# Patient Record
Sex: Male | Born: 2005 | Race: White | Hispanic: No | Marital: Single | Smoking: Current every day smoker
Health system: Southern US, Community
[De-identification: ages and names within clinical notes are randomized; demographics above are authoritative.]

## PROBLEM LIST (undated history)

## (undated) DIAGNOSIS — E0500 Thyrotoxicosis with diffuse goiter without thyrotoxic crisis or storm: Secondary | ICD-10-CM

## (undated) DIAGNOSIS — E119 Type 2 diabetes mellitus without complications: Secondary | ICD-10-CM

## (undated) DIAGNOSIS — F909 Attention-deficit hyperactivity disorder, unspecified type: Secondary | ICD-10-CM

## (undated) DIAGNOSIS — R011 Cardiac murmur, unspecified: Secondary | ICD-10-CM

## (undated) HISTORY — DX: Thyrotoxicosis with diffuse goiter without thyrotoxic crisis or storm: E05.00

---
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
## 2024-01-03 ENCOUNTER — Encounter: Payer: Self-pay | Admitting: "Endocrinology

## 2024-01-03 ENCOUNTER — Ambulatory Visit (INDEPENDENT_AMBULATORY_CARE_PROVIDER_SITE_OTHER): Admitting: "Endocrinology

## 2024-01-03 VITALS — BP 106/74 | HR 112 | Ht 67.0 in | Wt 117.2 lb

## 2024-01-03 DIAGNOSIS — E1065 Type 1 diabetes mellitus with hyperglycemia: Secondary | ICD-10-CM | POA: Diagnosis not present

## 2024-01-03 DIAGNOSIS — Z91199 Patient's noncompliance with other medical treatment and regimen due to unspecified reason: Secondary | ICD-10-CM | POA: Diagnosis not present

## 2024-01-03 DIAGNOSIS — E0590 Thyrotoxicosis, unspecified without thyrotoxic crisis or storm: Secondary | ICD-10-CM

## 2024-01-03 DIAGNOSIS — Z794 Long term (current) use of insulin: Secondary | ICD-10-CM | POA: Insufficient documentation

## 2024-01-03 LAB — POCT GLYCOSYLATED HEMOGLOBIN (HGB A1C): HbA1c, POC (controlled diabetic range): 11.2 % — AB (ref 0.0–7.0)

## 2024-01-03 MED ORDER — DEXCOM G7 SENSOR MISC: 1 refills | Status: AC

## 2024-01-03 MED ORDER — NOVOLOG FLEXPEN RELION 100 UNIT/ML ~~LOC~~ SOPN
6.0000 [IU] | PEN_INJECTOR | Freq: Three times a day (TID) | SUBCUTANEOUS | 1 refills | Status: DC
Start: 2024-01-03 — End: 2024-03-25

## 2024-01-03 MED ORDER — LANTUS SOLOSTAR 100 UNIT/ML ~~LOC~~ SOPN: 22.0000 [IU] | PEN_INJECTOR | Freq: Every day | SUBCUTANEOUS | 1 refills | Status: DC

## 2024-01-03 NOTE — Progress Notes (Signed)
 01/03/2024, 1:38 PM   Endocrinology follow-up note  Subjective:    Patient ID: Lawrence [name], male    DOB: 2006-03-04.  Lawrence [name] is being seen in follow-up after he was seen in consultation for management of currently uncontrolled symptomatic diabetes requested by  Trudy Vaughn FALCON, MD.  Despite the fact that structured plan was given to him during his last visit, admittedly patient did not take care of himself.  He did not have hospitalizations since last visit, however presents with severe hyperglycemia averaging 273 mg per DL.    Patient is accompanied to clinic by his fiance.  Past Medical History:  Diagnosis Date   ADHD (attention deficit hyperactivity disorder)    Diabetes mellitus without complication (HCC)    type 1-diagnosd 04/23/2018   Graves disease    diagnosed 11/2014   Heart murmur     History reviewed. No pertinent surgical history.  Social History   Socioeconomic History   Marital status: Single    Spouse name: Not on file   Number of children: Not on file   Years of education: Not on file   Highest education level: Not on file  Occupational History   Not on file  Tobacco Use   Smoking status: Every Day    Current packs/day: 1.00    Average packs/day: 1 pack/day for 3.0 years (3.0 ttl pk-yrs)    Types: Cigarettes    Passive exposure: Yes   Smokeless tobacco: Never  Vaping Use   Vaping status: Never Used  Substance and Sexual Activity   Alcohol use: No   Drug use: No   Sexual activity: Never  Other Topics Concern   Not on file  Social History Narrative   Lives at home with mother, father, 1 brother, 2 sisters. There is smoke exposure in the home, but mother states that they smoke outside.   Lawrence [name] is home schooled and is in 94 th grade (2023-2024).   There are cats and dogs in the home.   Social Drivers of Corporate investment banker Strain: Not on file  Food  Insecurity: No Food Insecurity (07/12/2023)   Hunger Vital Sign    Worried About Running Out of Food in the Last Year: Never true    Ran Out of Food in the Last Year: Never true  Transportation Needs: No Transportation Needs (07/12/2023)   PRAPARE - Administrator, Civil Service (Medical): No    Lack of Transportation (Non-Medical): No  Physical Activity: Not on file  Stress: Not on file  Social Connections: Not on file    Family History  Problem Relation Age of Onset   Arthritis Mother    Gestational diabetes Mother    Hyperlipidemia Maternal Grandmother    Arthritis Maternal Grandmother    Thyroid  disease Maternal Grandmother        hypothyroid   Diabetes Maternal Grandmother    Diabetes Maternal Grandfather    Depression Paternal Grandmother    Thyroid  disease Father        hypothyroid    Outpatient Encounter Medications as of 01/03/2024  Medication Sig  Accu-Chek Softclix Lancets lancets Use as directed to check glucose 6x/day.   BD PEN NEEDLE NANO 2ND GEN 32G X 4 MM MISC USE 1 PEN NEEDLE UP TO SIX TIMES DAILY   Blood Glucose Monitoring Suppl (ACCU-CHEK GUIDE ME) w/Device KIT 1 Piece by Does not apply route as directed.   Blood Glucose Monitoring Suppl (ACCU-CHEK GUIDE) w/Device KIT Use to check blood sugar 6 times per day   Continuous Glucose Receiver (DEXCOM G7 RECEIVER) DEVI Use to monitor BG continuously   Continuous Glucose Sensor (DEXCOM G7 SENSOR) MISC USE 1 SENSOR TO CHECK GLUCOSE EVERY 10 DAYS   FLUoxetine (PROZAC) 20 MG capsule Take 20 mg by mouth daily. (Patient not taking: Reported on 07/12/2023)   Glucagon  (BAQSIMI  TWO PACK) 3 MG/DOSE POWD Place 1 each into the nose as needed (severe hypoglycmia with unresponsiveness).   glucose blood (ACCU-CHEK GUIDE) test strip Use as directed to check glucose 6x/day.   glucose blood (ACCU-CHEK GUIDE) test strip Use to monitor glucose 4 times a day as instructed   insulin  aspart (NOVOLOG  FLEXPEN) 100 UNIT/ML FlexPen  Inject 6-12 Units into the skin 3 (three) times daily with meals.   insulin  glargine (LANTUS  SOLOSTAR) 100 UNIT/ML Solostar Pen Inject 22 Units into the skin at bedtime.   pantoprazole  (PROTONIX ) 40 MG tablet Take 1 tablet (40 mg total) by mouth daily.   [DISCONTINUED] Continuous Glucose Sensor (DEXCOM G7 SENSOR) MISC USE 1 SENSOR TO CHECK GLUCOSE EVERY 10 DAYS   [DISCONTINUED] insulin  aspart (NOVOLOG  FLEXPEN) 100 UNIT/ML FlexPen Inject 4 Units into the skin 3 (three) times daily with meals.   [DISCONTINUED] insulin  glargine (LANTUS  SOLOSTAR) 100 UNIT/ML Solostar Pen Inject 20 Units into the skin at bedtime.   [DISCONTINUED] methimazole  (TAPAZOLE ) 10 MG tablet Take 10 mg by mouth at bedtime. (Patient not taking: Reported on 01/03/2024)   No facility-administered encounter medications on file as of 01/03/2024.    ALLERGIES: No Known Allergies  VACCINATION STATUS:  There is no immunization history on file for this patient.  Diabetes He presents for his follow-up diabetic visit. He has type 1 diabetes mellitus. Onset time: He was diagnosed at approximate age of 12 years. His disease course has been worsening. There are no hypoglycemic associated symptoms. Pertinent negatives for hypoglycemia include no confusion, headaches, pallor or seizures. Associated symptoms include polydipsia and polyuria. Pertinent negatives for diabetes include no chest pain, no fatigue, no polyphagia and no weakness. There are no hypoglycemic complications. Symptoms are worsening. There are no diabetic complications. (Repeated episodes of diabetes ketoacidosis the latest in July 2023.) Risk factors for coronary artery disease include diabetes mellitus and male sex. Current diabetic treatment includes insulin  injections. He is compliant with treatment none of the time. His weight is fluctuating minimally. He is following a generally unhealthy diet. When asked about meal planning, he reported none. He has not had a previous  visit with a dietitian. His home blood glucose trend is increasing steadily. His breakfast blood glucose range is generally >200 mg/dl. His lunch blood glucose range is generally >200 mg/dl. His dinner blood glucose range is generally >200 mg/dl. His bedtime blood glucose range is generally >200 mg/dl. His overall blood glucose range is >200 mg/dl. (  He presents with no significant change in his glycemic profile mainly due to nonengagement with self-care plan he was given during his last visit.  He did not have hospitalizations since last visit.  He is Dexcom overview shows 19% time in range, 21% Level One hyperglycemia, 60% limited  to hyperglycemia.   Admittedly, patient has not been taking care of himself including skipping half of his prandial insulin  injection opportunities and some of his program as a pertains as well.  His average blood glucose is 273 for the most recent 2 weeks.  His point-of-care A1c was 11.2%, unchanged from his last visit. ) An ACE inhibitor/angiotensin II receptor blocker is not being taken.   Hyperthyroidism: Patient has history of hyperthyroidism treated with methimazole  in the past.  He did not take methimazole  since February 2025.  His previsit labs are consistent with euthyroid state.        Objective:       01/03/2024    1:04 PM 07/13/2023    2:53 PM 07/13/2023   11:00 AM  Vitals with BMI  Height 5' 7 5' 7   Weight 117 lbs 3 oz 124 lbs 13 oz   BMI 18.35 19.54   Systolic 106 112 877  Diastolic 74 84 70  Pulse 112 76 79    BP 106/74   Pulse (!) 112   Ht 5' 7 (1.702 m)   Wt 117 lb 3.2 oz (53.2 kg)   BMI 18.36 kg/m   Wt Readings from Last 3 Encounters:  01/03/24 117 lb 3.2 oz (53.2 kg) (3%, Z= -1.96)*  07/13/23 124 lb 12.8 oz (56.6 kg) (8%, Z= -1.38)*  07/13/23 116 lb 13.5 oz (53 kg) (3%, Z= -1.90)*   * Growth percentiles are based on CDC (Boys, 2-20 Years) data.    Patient has a hesitant affect.   CMP ( most recent) CMP     Component Value  Date/Time   NA 136 12/25/2023 1026   K 4.2 12/25/2023 1026   CL 97 12/25/2023 1026   CO2 20 12/25/2023 1026   GLUCOSE 273 (H) 12/25/2023 1026   GLUCOSE 183 (H) 07/13/2023 0544   BUN 10 12/25/2023 1026   CREATININE 0.91 12/25/2023 1026   CREATININE 0.78 07/27/2021 1548   CALCIUM 9.2 12/25/2023 1026   PROT 7.2 12/25/2023 1026   ALBUMIN 4.6 12/25/2023 1026   AST 12 12/25/2023 1026   ALT 10 12/25/2023 1026   ALKPHOS 94 12/25/2023 1026   BILITOT 0.8 12/25/2023 1026   EGFR 125 12/25/2023 1026   GFRNONAA >60 07/13/2023 0544     Diabetic Labs (most recent): Lab Results  Component Value Date   HGBA1C 11.2 (A) 01/03/2024   HGBA1C 11.3 (H) 05/17/2023   HGBA1C 11.0 (A) 12/28/2022   MICROALBUR 0.6 07/27/2021     Lipid Panel ( most recent) Lipid Panel     Component Value Date/Time   CHOL 172 (H) 07/05/2023 0916   TRIG 156 (H) 07/05/2023 0916   HDL 47 07/05/2023 0916   CHOLHDL 3.7 07/05/2023 0916   CHOLHDL 1.9 07/27/2021 1548   LDLCALC 98 07/05/2023 0916   LDLCALC 31 07/27/2021 1548   LABVLDL 27 07/05/2023 0916      Lab Results  Component Value Date   TSH 0.911 12/25/2023   TSH 0.759 07/05/2023   TSH 0.346 (L) 05/17/2023   TSH 0.658 03/02/2023   TSH 0.315 (L) 12/28/2021   TSH 0.87 07/27/2021   TSH 0.513 06/12/2021   TSH 0.476 01/24/2021   TSH <0.010 (L) 12/27/2019   TSH <0.010 (L) 12/25/2019   FREET4 1.39 12/25/2023   FREET4 1.51 07/05/2023   FREET4 1.80 (H) 03/02/2023   FREET4 1.21 (H) 12/28/2021   FREET4 1.3 07/27/2021   FREET4 0.96 06/12/2021   FREET4 0.96 01/24/2021   FREET4  3.49 (H) 12/27/2019   FREET4 3.88 (H) 12/25/2019   FREET4 2.20 (H) 12/22/2014      Assessment & Plan:   1. Uncontrolled type 1 diabetes mellitus with hyperglycemia (HCC)  - Lawrence [name] has currently uncontrolled symptomatic type 1 DM since  18 years of age.   He presents with no significant change in his glycemic profile mainly due to nonengagement with self-care plan he was given  during his last visit.  He did not have hospitalizations since last visit.  He is Dexcom overview shows 19% time in range, 21% Level One hyperglycemia, 60% limited to hyperglycemia.   Admittedly, patient has not been taking care of himself including skipping half of his prandial insulin  injection opportunities and some of his program as a pertains as well.  His average blood glucose is 273 for the most recent 2 weeks.  His point-of-care A1c was 11.2%, unchanged from his last visit.   He was accompanied by his mother to clinic. - I had a long discussion with him about the possible risk factors and  the pathology behind its diabetes and its complications. -his diabetes is complicated by repeated DKA and he remains at exceedingly  high risk for more acute and chronic complications which include CAD, CVA, CKD, retinopathy, and neuropathy. These are all discussed in detail with him.  - I discussed all available options of managing his diabetes including de-escalation of medications. I have counseled him on Food as Medicine by adopting a Whole Food , Plant Predominant  ( WFPP) nutrition as recommended by [employer] of Lifestyle Medicine. Patient is encouraged to switch to  unprocessed or minimally processed  complex starch, adequate protein intake (mainly plant source), minimal liquid fat, plenty of fruits, and vegetables. -  he is advised to stick to a routine mealtimes to eat 3 complete meals a day and snack only when necessary ( to snack only to correct hypoglycemia BG <70 day time or <100 at night).    - he acknowledges that there is a room for improvement in his food and drink choices. - Further Specific Suggestion is made for him to avoid simple carbohydrates  from his diet including Cakes, Sweet Desserts, Ice Cream, Soda (diet and regular), Sweet Tea, Candies, Chips, Cookies, Store Bought Juices, Alcohol ,  Artificial Sweeteners,  Coffee Creamer, and Sugar-free Products. This will help patient  to have more stable blood glucose profile and potentially avoid unintended weight gain.   - I have approached him with the following individualized plan to manage  his diabetes and patient agrees:   -He has used insulin  pump therapy on 2 different occasions in the past, did not like his experience.   He will be considered for OmniPod. In light of his chronically uncontrolled type 1 diabetes, he will continue to need intensive treatment with basal/bolus insulin  in order for him to achieve control of diabetes to target.   - Accordingly, he is advised to resume and increase Lantus  to 22 units nightly, resume and increase NovoLog  to 6  units 3 times a day with meals  for pre-meal BG readings of 90-150mg /dl, plus patient specific correction dose for unexpected hyperglycemia above 150mg /dl, associated with strict monitoring of glucose 4 times a day-before meals and at bedtime. He is encouraged to wear his CGM at all times. - he is warned not to take insulin  without proper monitoring per orders. - Adjustment parameters are given to him for hypo and hyperglycemia in writing. - he is encouraged  to call clinic for blood glucose levels less than 70 or above 250 mg /dl weekly average blood glucose. He is not a candidate for non-insulin  treatment options at this time.  - Specific targets for  A1c;  LDL, HDL,  and Triglycerides were discussed with the patient.  2) Blood Pressure /Hypertension: His blood pressure is controlled to target.  He is not on medications. 3) Lipids/Hyperlipidemia: His recent lipid panel showed LDL of 88.  He is not on statins.   4) history of hyperthyroidism: He has taken himself off of methimazole , and his recent thyroid  function test were consistent with euthyroid state.  He was diagnosed at approximate age of 2.  He will have repeat thyroid  function tests before his next visit.   5)  Weight/Diet:  Body mass index is 18.36 kg/m.  -     he is not  a candidate for weight loss.    6) Chronic Care/Health Maintenance:  -he  is not  on ACEI/ARB and Statin medications and  is encouraged to initiate and continue to follow up with Ophthalmology, Dentist,  Podiatrist at least yearly or according to recommendations, and advised to   stay away from smoking. I have recommended yearly flu vaccine and pneumonia vaccine at least every 5 years; moderate intensity exercise for up to 150 minutes weekly; and  sleep for 7- 9 hours a day. The patient was counseled on the dangers of tobacco use, and was advised to quit.  Reviewed strategies to maximize success, including removing cigarettes and smoking materials from environment.  Unfortunately this patient's challenge is absolute absence of engagement in self-care and type 1 diabetes.  The bright side is that patient understands the magnitude of risk from his diabetes and the need for consistent management.  The family is counseled on better engagement in order for him to receive optimal care for diabetes. - he is  advised to maintain close follow up with Trudy Vaughn FALCON, MD for primary care needs, as well as his other providers for optimal and coordinated care.   I spent  26  minutes in the care of the patient today including review of labs from CMP, Lipids, Thyroid  Function, Hematology (current and previous including abstractions from other facilities); face-to-face time discussing  his blood glucose readings/logs, discussing hypoglycemia and hyperglycemia episodes and symptoms, medications doses, his options of short and long term treatment based on the latest standards of care / guidelines;  discussion about incorporating lifestyle medicine;  and documenting the encounter. Risk reduction counseling performed per USPSTF guidelines to reduce cardiovascular risk factors.     Please refer to Patient Instructions for Blood Glucose Monitoring and Insulin /Medications Dosing Guide  in media tab for additional information. Please  also refer to   Patient Self Inventory in the Media  tab for reviewed elements of pertinent patient history.  Lawrence [name] participated in the discussions, expressed understanding, and voiced agreement with the above plans.  All questions were answered to his satisfaction. he is encouraged to contact clinic should he have any questions or concerns prior to his return visit.  Follow up plan: - Return in about 4 months (around 05/05/2024) for F/U with Pre-visit Labs, Meter/CGM/Logs, A1c here.  Lawrence Earl, MD [facility] [address] [place], [place] [address] Phone: [phone]  Fax: [phone]    01/03/2024, 1:38 PM  This note was partially dictated with voice recognition software. Similar sounding words can be transcribed inadequately or may not  be corrected upon review.

## 2024-02-27 ENCOUNTER — Other Ambulatory Visit: Payer: Self-pay

## 2024-02-27 DIAGNOSIS — E1065 Type 1 diabetes mellitus with hyperglycemia: Secondary | ICD-10-CM

## 2024-02-27 MED ORDER — LANTUS SOLOSTAR 100 UNIT/ML ~~LOC~~ SOPN: 22.0000 [IU] | PEN_INJECTOR | Freq: Every day | SUBCUTANEOUS | 0 refills | Status: DC

## 2024-03-22 ENCOUNTER — Other Ambulatory Visit: Payer: Self-pay | Admitting: "Endocrinology

## 2024-03-22 DIAGNOSIS — E1065 Type 1 diabetes mellitus with hyperglycemia: Secondary | ICD-10-CM

## 2024-04-08 ENCOUNTER — Telehealth: Payer: Self-pay

## 2024-04-08 ENCOUNTER — Other Ambulatory Visit (HOSPITAL_COMMUNITY): Payer: Self-pay

## 2024-04-08 NOTE — Telephone Encounter (Signed)
 Pharmacy Patient Advocate Encounter   Received notification from CoverMyMeds that prior authorization for Dexcom G7 sensor is required/requested.   Insurance verification completed.   The patient is insured through [facility].   Per test claim: PA required; PA submitted to above mentioned insurance via Latent Key/confirmation #/EOC [id number] Status is pending

## 2024-04-22 ENCOUNTER — Other Ambulatory Visit (HOSPITAL_COMMUNITY): Payer: Self-pay

## 2024-04-30 LAB — COMPREHENSIVE METABOLIC PANEL WITH GFR
ALT: 11 IU/L (ref 0–44)
AST: 12 IU/L (ref 0–40)
Albumin: 5 g/dL (ref 4.3–5.2)
Alkaline Phosphatase: 103 IU/L (ref 51–125)
BUN/Creatinine Ratio: 23 — ABNORMAL HIGH (ref 9–20)
BUN: 17 mg/dL (ref 6–20)
Bilirubin Total: 0.9 mg/dL (ref 0.0–1.2)
CO2: 23 mmol/L (ref 20–29)
Calcium: 10 mg/dL (ref 8.7–10.2)
Chloride: 96 mmol/L (ref 96–106)
Creatinine, Ser: 0.74 mg/dL — ABNORMAL LOW (ref 0.76–1.27)
Globulin, Total: 2.7 g/dL (ref 1.5–4.5)
Glucose: 294 mg/dL — ABNORMAL HIGH (ref 70–99)
Potassium: 4.9 mmol/L (ref 3.5–5.2)
Sodium: 135 mmol/L (ref 134–144)
Total Protein: 7.7 g/dL (ref 6.0–8.5)
eGFR: 134 mL/min/1.73 (ref >59)

## 2024-04-30 LAB — TSH: TSH: 0.504 u[IU]/mL (ref 0.450–4.500)

## 2024-04-30 LAB — CORTISOL-AM, BLOOD: Cortisol - AM: 13.4 ug/dL (ref 6.2–19.4)

## 2024-04-30 LAB — T4, FREE: Free T4: 1.36 ng/dL (ref 0.93–1.60)

## 2024-04-30 LAB — T3: T3, Total: 120 ng/dL (ref 71–180)

## 2024-05-06 ENCOUNTER — Encounter: Payer: Self-pay | Admitting: "Endocrinology

## 2024-05-06 ENCOUNTER — Ambulatory Visit (INDEPENDENT_AMBULATORY_CARE_PROVIDER_SITE_OTHER): Admitting: "Endocrinology

## 2024-05-06 VITALS — BP 108/70 | HR 88 | Ht 67.0 in | Wt 128.6 lb

## 2024-05-06 DIAGNOSIS — E1065 Type 1 diabetes mellitus with hyperglycemia: Secondary | ICD-10-CM | POA: Diagnosis not present

## 2024-05-06 DIAGNOSIS — Z91199 Patient's noncompliance with other medical treatment and regimen due to unspecified reason: Secondary | ICD-10-CM | POA: Diagnosis not present

## 2024-05-06 DIAGNOSIS — F17200 Nicotine dependence, unspecified, uncomplicated: Secondary | ICD-10-CM | POA: Insufficient documentation

## 2024-05-06 LAB — POCT GLYCOSYLATED HEMOGLOBIN (HGB A1C): HbA1c, POC (controlled diabetic range): 10.6 % — AB (ref 0.0–7.0)

## 2024-05-06 MED ORDER — NOVOLOG FLEXPEN RELION 100 UNIT/ML ~~LOC~~ SOPN: 8.0000 [IU] | PEN_INJECTOR | Freq: Three times a day (TID) | SUBCUTANEOUS | 1 refills | Status: DC

## 2024-05-06 MED ORDER — LANTUS SOLOSTAR 100 UNIT/ML ~~LOC~~ SOPN: 24.0000 [IU] | PEN_INJECTOR | Freq: Every day | SUBCUTANEOUS | 0 refills | Status: AC

## 2024-05-06 NOTE — Progress Notes (Signed)
 05/06/2024, 12:05 PM   Endocrinology follow-up note  Subjective:    Patient ID: Lawrence [name], male    DOB: [birth year].  Lawrence [name] is being seen in follow-up after he was seen in consultation for management of currently uncontrolled symptomatic diabetes requested by  Trudy Vaughn FALCON, MD.  Despite the fact that structured plan was given to him during his last visit, admittedly patient did not take care of himself.  He did not have hospitalizations since last visit, however presents with severe hyperglycemia averaging 273 mg per DL.    Patient is accompanied to clinic by his fiance.  Past Medical History:  Diagnosis Date   ADHD (attention deficit hyperactivity disorder)    Diabetes mellitus without complication (HCC)    type 1-diagnosd 04/23/2018   Graves disease    diagnosed 11/2014   Heart murmur     History reviewed. No pertinent surgical history.  Social History   Socioeconomic History   Marital status: Single    Spouse name: Not on file   Number of children: Not on file   Years of education: Not on file   Highest education level: Not on file  Occupational History   Not on file  Tobacco Use   Smoking status: Every Day    Current packs/day: 1.00    Average packs/day: 1 pack/day for 3.0 years (3.0 ttl pk-yrs)    Types: Cigarettes    Passive exposure: Yes   Smokeless tobacco: Never  Vaping Use   Vaping status: Never Used  Substance and Sexual Activity   Alcohol use: No   Drug use: No   Sexual activity: Never  Other Topics Concern   Not on file  Social History Narrative   Lives at home with mother, father, 1 brother, 2 sisters. There is smoke exposure in the home, but mother states that they smoke outside.   [name] is home schooled and is in 63 th grade (2023-2024).   There are cats and dogs in the home.   Social Drivers of Corporate Investment Banker Strain: Not on file   Food Insecurity: No Food Insecurity (07/12/2023)   Hunger Vital Sign    Worried About Running Out of Food in the Last Year: Never true    Ran Out of Food in the Last Year: Never true  Transportation Needs: No Transportation Needs (07/12/2023)   PRAPARE - Administrator, Civil Service (Medical): No    Lack of Transportation (Non-Medical): No  Physical Activity: Not on file  Stress: Not on file  Social Connections: Not on file    Family History  Problem Relation Age of Onset   Arthritis Mother    Gestational diabetes Mother    Hyperlipidemia Maternal Grandmother    Arthritis Maternal Grandmother    Thyroid  disease Maternal Grandmother        hypothyroid   Diabetes Maternal Grandmother    Diabetes Maternal Grandfather    Depression Paternal Grandmother    Thyroid  disease Father        hypothyroid    Outpatient Encounter Medications as of 05/06/2024  Medication Sig  Accu-Chek Softclix Lancets lancets Use as directed to check glucose 6x/day.   BD PEN NEEDLE NANO 2ND GEN 32G X 4 MM MISC USE 1 PEN NEEDLE UP TO SIX TIMES DAILY   Blood Glucose Monitoring Suppl (ACCU-CHEK GUIDE ME) w/Device KIT 1 Piece by Does not apply route as directed.   Blood Glucose Monitoring Suppl (ACCU-CHEK GUIDE) w/Device KIT Use to check blood sugar 6 times per day   Continuous Glucose Receiver (DEXCOM G7 RECEIVER) DEVI Use to monitor BG continuously   Continuous Glucose Sensor (DEXCOM G7 SENSOR) MISC USE 1 SENSOR TO CHECK GLUCOSE EVERY 10 DAYS   FLUoxetine (PROZAC) 20 MG capsule Take 20 mg by mouth daily. (Patient not taking: Reported on 07/12/2023)   Glucagon  (BAQSIMI  TWO PACK) 3 MG/DOSE POWD Place 1 each into the nose as needed (severe hypoglycmia with unresponsiveness).   glucose blood (ACCU-CHEK GUIDE) test strip Use as directed to check glucose 6x/day.   glucose blood (ACCU-CHEK GUIDE) test strip Use to monitor glucose 4 times a day as instructed   insulin  aspart (NOVOLOG  FLEXPEN) 100 UNIT/ML  FlexPen Inject 8-11 Units into the skin 3 (three) times daily with meals.   insulin  glargine (LANTUS  SOLOSTAR) 100 UNIT/ML Solostar Pen Inject 24 Units into the skin at bedtime.   pantoprazole  (PROTONIX ) 40 MG tablet Take 1 tablet (40 mg total) by mouth daily.   [DISCONTINUED] insulin  aspart (NOVOLOG  FLEXPEN) 100 UNIT/ML FlexPen Inject 6 Units into the skin 3 (three) times daily with meals.   [DISCONTINUED] insulin  glargine (LANTUS  SOLOSTAR) 100 UNIT/ML Solostar Pen Inject 22 Units into the skin at bedtime.   No facility-administered encounter medications on file as of 05/06/2024.    ALLERGIES: No Known Allergies  VACCINATION STATUS:  There is no immunization history on file for this patient.  Diabetes He presents for his follow-up diabetic visit. He has type 1 diabetes mellitus. Onset time: He was diagnosed at approximate age of 18 years. His disease course has been improving. There are no hypoglycemic associated symptoms. Pertinent negatives for hypoglycemia include no confusion, headaches, pallor or seizures. Pertinent negatives for diabetes include no chest pain, no fatigue, no polydipsia, no polyphagia, no polyuria and no weakness. There are no hypoglycemic complications. Symptoms are improving. There are no diabetic complications. (Repeated episodes of diabetes ketoacidosis the latest in July 2023.) Risk factors for coronary artery disease include diabetes mellitus, male sex and tobacco exposure. Current diabetic treatment includes insulin  injections. He is compliant with treatment none of the time. His weight is increasing steadily. He is following a generally unhealthy diet. When asked about meal planning, he reported none. He has not had a previous visit with a dietitian. His home blood glucose trend is decreasing steadily. His breakfast blood glucose range is generally >200 mg/dl. His lunch blood glucose range is generally >200 mg/dl. His dinner blood glucose range is generally >200 mg/dl.  His bedtime blood glucose range is generally >200 mg/dl. His overall blood glucose range is >200 mg/dl. ( He presents with some mild improvement in his glycemic profile, but still significantly above target.  His Dexcom analysis shows 30% within range, 32% level 1 hyperglycemia, 36% level 2 hyperglycemia.  He has no significant hypoglycemia.  His point-of-care A1c is 10.6% improving from 11.2%.  His average blood glucose is 219 mg per DL for the most recent 2 weeks.  This is considered an improvement from 273 during his last presentation. Admittedly, he has been missing at least a third of his prandial insulin  injection opportunities.) An  ACE inhibitor/angiotensin II receptor blocker is not being taken.      Objective:       05/06/2024    9:23 AM 01/03/2024    1:04 PM 07/13/2023    2:53 PM  Vitals with BMI  Height 5' 7 5' 7 5' 7  Weight 128 lbs 10 oz 117 lbs 3 oz 124 lbs 13 oz  BMI 20.14 18.35 19.54  Systolic 108 106 887  Diastolic 70 74 84  Pulse 88 112 76    BP 108/70   Pulse 88   Ht 5' 7 (1.702 m)   Wt 128 lb 9.6 oz (58.3 kg)   BMI 20.14 kg/m   Wt Readings from Last 3 Encounters:  05/06/24 128 lb 9.6 oz (58.3 kg) (10%, Z= -1.28)*  01/03/24 117 lb 3.2 oz (53.2 kg) (3%, Z= -1.96)*  07/13/23 124 lb 12.8 oz (56.6 kg) (8%, Z= -1.38)*   * Growth percentiles are based on CDC (Boys, 2-20 Years) data.    Patient has a hesitant affect.   CMP ( most recent) CMP     Component Value Date/Time   NA 135 04/29/2024 0941   K 4.9 04/29/2024 0941   CL 96 04/29/2024 0941   CO2 23 04/29/2024 0941   GLUCOSE 294 (H) 04/29/2024 0941   GLUCOSE 183 (H) 07/13/2023 0544   BUN 17 04/29/2024 0941   CREATININE 0.74 (L) 04/29/2024 0941   CREATININE 0.78 07/27/2021 1548   CALCIUM 10.0 04/29/2024 0941   PROT 7.7 04/29/2024 0941   ALBUMIN 5.0 04/29/2024 0941   AST 12 04/29/2024 0941   ALT 11 04/29/2024 0941   ALKPHOS 103 04/29/2024 0941   BILITOT 0.9 04/29/2024 0941   EGFR 134 04/29/2024  0941   GFRNONAA >60 07/13/2023 0544     Diabetic Labs (most recent): Lab Results  Component Value Date   HGBA1C 10.6 (A) 05/06/2024   HGBA1C 11.2 (A) 01/03/2024   HGBA1C 11.3 (H) 05/17/2023   MICROALBUR 0.6 07/27/2021     Lipid Panel ( most recent) Lipid Panel     Component Value Date/Time   CHOL 172 (H) 07/05/2023 0916   TRIG 156 (H) 07/05/2023 0916   HDL 47 07/05/2023 0916   CHOLHDL 3.7 07/05/2023 0916   CHOLHDL 1.9 07/27/2021 1548   LDLCALC 98 07/05/2023 0916   LDLCALC 31 07/27/2021 1548   LABVLDL 27 07/05/2023 0916      Lab Results  Component Value Date   TSH 0.504 04/29/2024   TSH 0.911 12/25/2023   TSH 0.759 07/05/2023   TSH 0.346 (L) 05/17/2023   TSH 0.658 03/02/2023   TSH 0.315 (L) 12/28/2021   TSH 0.87 07/27/2021   TSH 0.513 06/12/2021   TSH 0.476 01/24/2021   TSH <0.010 (L) 12/27/2019   FREET4 1.36 04/29/2024   FREET4 1.39 12/25/2023   FREET4 1.51 07/05/2023   FREET4 1.80 (H) 03/02/2023   FREET4 1.21 (H) 12/28/2021   FREET4 1.3 07/27/2021   FREET4 0.96 06/12/2021   FREET4 0.96 01/24/2021   FREET4 3.49 (H) 12/27/2019   FREET4 3.88 (H) 12/25/2019      Assessment & Plan:   1. Uncontrolled type 1 diabetes mellitus with hyperglycemia (HCC)  - ELIER ZELLARS has currently uncontrolled symptomatic type 1 DM since  18 years of age.   He presents with some mild improvement in his glycemic profile, but still significantly above target.  His Dexcom analysis shows 30% within range, 32% level 1 hyperglycemia, 36% level 2 hyperglycemia.  He has no  significant hypoglycemia.  His point-of-care A1c is 10.6% improving from 11.2%.  His average blood glucose is 219 mg per DL for the most recent 2 weeks.  This is considered an improvement from 273 during his last presentation. Admittedly, he has been missing at least a third of his prandial insulin  injection opportunities.   - I had a long discussion with him about the possible risk factors and  the pathology behind  its diabetes and its complications. -his diabetes is complicated by repeated DKA and he remains at exceedingly  high risk for more acute and chronic complications which include CAD, CVA, CKD, retinopathy, and neuropathy. These are all discussed in detail with him.  - I discussed all available options of managing his diabetes including de-escalation of medications. I have counseled him on Food as Medicine by adopting a Whole Food , Plant Predominant  ( WFPP) nutrition as recommended by [employer] of Lifestyle Medicine. Patient is encouraged to switch to  unprocessed or minimally processed  complex starch, adequate protein intake (mainly plant source), minimal liquid fat, plenty of fruits, and vegetables. -  he is advised to stick to a routine mealtimes to eat 3 complete meals a day and snack only when necessary ( to snack only to correct hypoglycemia BG <70 day time or <100 at night).   - he acknowledges that there is a room for improvement in his food and drink choices. - Further Specific Suggestion is made for him to avoid simple carbohydrates  from his diet including Cakes, Sweet Desserts, Ice Cream, Soda (diet and regular), Sweet Tea, Candies, Chips, Cookies, Store Bought Juices, Alcohol ,  Artificial Sweeteners,  Coffee Creamer, and Sugar-free Products. This will help patient to have more stable blood glucose profile and potentially avoid unintended weight gain.   - I have approached him with the following individualized plan to manage  his diabetes and patient agrees:   -He has used insulin  pump therapy on 2 different occasions in the past, did not like his experience.   He will be considered for OmniPod. In light of his chronically uncontrolled type 1 diabetes, he will continue to need intensive treatment with basal/bolus insulin  in order for him to achieve control of diabetes to target.   - Accordingly, he is advised to increase Lantus  to 24 units nightly, discussed and increased his  NovoLog  to 8  units 3 times a day with meals  for pre-meal BG readings of 90-150mg /dl, plus patient specific correction dose for unexpected hyperglycemia above 150mg /dl, associated with strict monitoring of glucose 4 times a day-before meals and at bedtime. He is encouraged to wear his CGM at all times.  - he is warned not to take insulin  without proper monitoring per orders. - Adjustment parameters are given to him for hypo and hyperglycemia in writing. - he is encouraged to call clinic for blood glucose levels less than 70 or above 250 mg /dl weekly average blood glucose. He is not a candidate for non-insulin  treatment options at this time.  - Specific targets for  A1c;  LDL, HDL,  and Triglycerides were discussed with the patient.  2) Blood Pressure /Hypertension: His blood pressure is controlled to target at 108 over 70 mm weekly.  He is not on antihypertensive medication for now.    3) Lipids/Hyperlipidemia: His recent lipid panel showed LDL of 88.  He is not on statins.   4) history of hyperthyroidism: He has taken himself off of methimazole , and his recent thyroid  function test  were consistent with euthyroid state.  He was diagnosed at approximate age of 67.  He will have repeat thyroid  function tests before his next visit.   5)  Weight/Diet:  Body mass index is 20.14 kg/m.  -     he is not  a candidate for weight loss.   6) Chronic Care/Health Maintenance:  -he  is not  on ACEI/ARB and Statin medications and  is encouraged to initiate and continue to follow up with Ophthalmology, Dentist,  Podiatrist at least yearly or according to recommendations, and advised to   stay away from smoking. I have recommended yearly flu vaccine and pneumonia vaccine at least every 5 years; moderate intensity exercise for up to 150 minutes weekly; and  sleep for 7- 9 hours a day. The patient was counseled on the dangers of tobacco use, and was advised to quit.  Reviewed strategies to maximize success,  including removing cigarettes and smoking materials from environment.  Unfortunately this patient's challenge is absolute absence of engagement in self-care and type 1 diabetes.  The bright side is that patient understands the magnitude of risk from his diabetes and the need for consistent management.  The family is counseled on better engagement in order for him to receive optimal care for diabetes.  The patient was counseled on the dangers of tobacco use, and was advised to quit.  Reviewed strategies to maximize success, including removing cigarettes and smoking materials from environment.  - he is  advised to maintain close follow up with Trudy Vaughn FALCON, MD for primary care needs, as well as his other providers for optimal and coordinated care.   I spent  25  minutes in the care of the patient today including review of labs from CMP, Lipids, Thyroid  Function, Hematology (current and previous including abstractions from other facilities); face-to-face time discussing  his blood glucose readings/logs, discussing hypoglycemia and hyperglycemia episodes and symptoms, medications doses, his options of short and long term treatment based on the latest standards of care / guidelines;  discussion about incorporating lifestyle medicine;  and documenting the encounter. Risk reduction counseling performed per USPSTF guidelines to reduce  cardiovascular risk factors.     Please refer to Patient Instructions for Blood Glucose Monitoring and Insulin /Medications Dosing Guide  in media tab for additional information. Please  also refer to  Patient Self Inventory in the Media  tab for reviewed elements of pertinent patient history.  Thedora LELON Monas participated in the discussions, expressed understanding, and voiced agreement with the above plans.  All questions were answered to his satisfaction. he is encouraged to contact clinic should he have any questions or concerns prior to his return visit.   Follow up  plan: - Return in about 4 months (around 09/03/2024) for Bring Meter/CGM Device/Logs- A1c in Office.  Ranny Earl, MD [facility] [address] [place], [place] [address] Phone: [phone]  Fax: [phone]    05/06/2024, 12:05 PM  This note was partially dictated with voice recognition software. Similar sounding words can be transcribed inadequately or may not  be corrected upon review.

## 2024-05-22 ENCOUNTER — Other Ambulatory Visit: Payer: Self-pay | Admitting: "Endocrinology

## 2024-05-22 DIAGNOSIS — E1065 Type 1 diabetes mellitus with hyperglycemia: Secondary | ICD-10-CM

## 2024-06-03 NOTE — Telephone Encounter (Signed)
 Pharmacy Patient Advocate Encounter  Received notification from [employer] that Prior Authorization for Dexcom G7 Sensor  has been APPROVED from 04/08/2024 to 04/08/2025   PA #/Case ID/Reference #: [id number]

## 2024-09-04 ENCOUNTER — Ambulatory Visit: Admitting: "Endocrinology
